# Patient Record
Sex: Male | Born: 1995 | Race: White | Hispanic: No | Marital: Married | Smoking: Former smoker
Health system: Southern US, Community
[De-identification: ages and names within clinical notes are randomized; demographics above are authoritative.]

---
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
## 2020-01-14 DIAGNOSIS — H5213 Myopia, bilateral: Secondary | ICD-10-CM | POA: Diagnosis not present

## 2020-01-14 DIAGNOSIS — H179 Unspecified corneal scar and opacity: Secondary | ICD-10-CM | POA: Diagnosis not present

## 2020-02-20 DIAGNOSIS — Z1152 Encounter for screening for COVID-19: Secondary | ICD-10-CM | POA: Diagnosis not present

## 2020-02-20 DIAGNOSIS — Z20822 Contact with and (suspected) exposure to covid-19: Secondary | ICD-10-CM | POA: Diagnosis not present

## 2021-11-21 ENCOUNTER — Other Ambulatory Visit: Payer: Self-pay

## 2021-11-21 ENCOUNTER — Ambulatory Visit
Admission: RE | Admit: 2021-11-21 | Discharge: 2021-11-21 | Discharge status: Against Medical Advice | Payer: Self-pay | Source: Ambulatory Visit

## 2022-06-01 DIAGNOSIS — M79632 Pain in left forearm: Secondary | ICD-10-CM | POA: Insufficient documentation

## 2022-06-01 DIAGNOSIS — S56912A Strain of unspecified muscles, fascia and tendons at forearm level, left arm, initial encounter: Secondary | ICD-10-CM | POA: Insufficient documentation

## 2022-06-17 ENCOUNTER — Ambulatory Visit
Admission: RE | Admit: 2022-06-17 | Discharge: 2022-06-17 | Disposition: A | Discharge status: Home / Self Care | Payer: Self-pay | Source: Ambulatory Visit | Attending: Internal Medicine | Admitting: Internal Medicine

## 2022-06-17 ENCOUNTER — Ambulatory Visit: Payer: Self-pay

## 2022-06-17 VITALS — BP 118/60 | HR 100 | Temp 97.9°F | Resp 16

## 2022-06-17 DIAGNOSIS — L0501 Pilonidal cyst with abscess: Secondary | ICD-10-CM

## 2022-06-17 MED ORDER — DOXYCYCLINE HYCLATE 100 MG PO CAPS: 100.0000 mg | ORAL_CAPSULE | Freq: Two times a day (BID) | ORAL | 0 refills | Status: AC

## 2022-06-17 NOTE — Discharge Instructions (Signed)
We drained your abscess today in the clinic and left open to continue to drain.  Continue to use warm compresses to the wound to further encourage drainage from the wound.  You may do this in the shower as well with gentle compresses to the area to allow more infected material to drain.   Take antibiotic as prescribed to treat infection to the abscess.   Change your dressings twice daily as the wound heals.  Do not apply any ointments, lotions, or powders to the wound.  You may take Tylenol as needed for pain once the numbing wears off.  If you notice any worsening signs of infection such as redness, swelling, fever, or worsening drainage, please return to urgent care for reevaluation.

## 2022-06-17 NOTE — ED Triage Notes (Signed)
Pt states cyst to right buttocks for the past 4 days.  Area red swollen no drainage noted.

## 2022-06-19 NOTE — ED Provider Notes (Signed)
[facility]    CSN: [id number] Arrival date & time: 06/17/22  1745      History   Chief Complaint Chief Complaint  Patient presents with   Abscess    Possible cyst - Entered by patient    HPI Tyler Jordan is a 27 y.o. male.   Patient presents to urgent care for evaluation of abscess to the superior gluteal cleft that he first noticed 4 days ago. He states the abscess has grown significantly in size and pain over the last 2-3 days and he is experiencing significant pain with remaining in a seated position. He has been performing epsom salt soaks, which has helped the abscess to soften, but states the abscess continues to grow warmer and more tender by the day. No recent antibiotic use. He has not had an abscess in this location in the past. States he has had "sores to the butt cheeks" but never abscess. 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 Denies fever, chills, body aches, recent falls/trauma/injuries to the upper gluteal cleft/low back, dizziness, and drainage from the abscess. Taking tylenol as needed for pain without much relief.    Abscess   History reviewed. No pertinent past medical history.  There are no problems to display for this patient.   History reviewed. No pertinent surgical history.     Home Medications    Prior to Admission medications   Medication Sig Start Date End Date Taking? Authorizing Provider  doxycycline (VIBRAMYCIN) 100 MG capsule Take 1 capsule (100 mg total) by mouth 2 (two) times daily for 7 days. 06/17/22 06/24/22 Yes Carlisle Beers, FNP  dexmethylphenidate (FOCALIN XR) 20 MG 24 hr capsule Take 20 mg by mouth daily.    [provider]    Family History History reviewed. No pertinent family history.  Social History Social History   Tobacco Use   Smoking status: Never  Substance Use Topics   Alcohol use: No   Drug use: No      Allergies   Patient has no known allergies.   Review of Systems Review of Systems Per HPI   Physical Exam Triage Vital Signs ED Triage Vitals  Enc Vitals Group     BP 06/17/22 1818 118/60     Pulse Rate 06/17/22 1818 100     Resp 06/17/22 1818 16     Temp 06/17/22 1818 97.9 F (36.6 C)     Temp Source 06/17/22 1818 Oral     SpO2 06/17/22 1818 97 %     Weight --      Height --      Head Circumference --      Peak Flow --      Pain Score 06/17/22 1817 7     Pain Loc --      Pain Edu? --      Excl. in GC? --    No data found.  Updated Vital Signs BP 118/60 (BP Location: Left Arm)   Pulse 100   Temp 97.9 F (36.6 C) (Oral)   Resp 16   SpO2 97%   Visual Acuity Right Eye Distance:   Left Eye Distance:   Bilateral Distance:    Right Eye Near:   Left Eye Near:    Bilateral Near:     Physical Exam Vitals and nursing note reviewed.  Constitutional:      Appearance: He is not ill-appearing or toxic-appearing.  HENT:     Head: Normocephalic and atraumatic.     Right Ear: Hearing and external ear normal.     Left Ear: Hearing and external ear normal.     Nose: Nose normal.     Mouth/Throat:     Lips: Pink.  Eyes:     General: Lids are normal. Vision grossly intact. Gaze aligned appropriately.     Extraocular Movements: Extraocular movements intact.     Conjunctiva/sclera: Conjunctivae normal.  Pulmonary:     Effort: Pulmonary effort is normal.  Musculoskeletal:     Cervical back: Neck supple.  Skin:    General: Skin is warm and dry.     Capillary Refill: Capillary refill takes less than 2 seconds.     Findings: No rash.          Comments: 2cm by 3cm pilonidal abscess to the right upper gluteal cleft. Central abscess is mildly fluctuant with surrounding area of induration, tenderness, erythema, and warmth.   Neurological:     General: No focal deficit present.     Mental Status: He is alert and oriented to person, place, and time. Mental status is  at baseline.     Cranial Nerves: No dysarthria or facial asymmetry.  Psychiatric:        Mood and Affect: Mood normal.        Speech: Speech normal.        Behavior: Behavior normal.        Thought Content: Thought content normal.        Judgment: Judgment normal.      UC Treatments / Results  Labs (all labs ordered are listed, but only abnormal results are displayed) Labs Reviewed - No data to display  EKG   Radiology No results found.  Procedures Incision and Drainage  Date/Time: 06/17/2022 8:46 PM  Performed by: Carlisle BeersStanhope, Shiva Karis M, FNP Authorized by: Carlisle BeersStanhope, Ernie Sagrero M, FNP   Consent:    Consent obtained:  Verbal   Consent given by:  Patient   Risks, benefits, and alternatives were discussed: yes     Risks discussed:  Bleeding, damage to other organs, infection, incomplete drainage and pain   Alternatives discussed:  No treatment and delayed treatment Universal protocol:    Patient identity confirmed:  Verbally with patient Location:    Type:  Pilonidal cyst   Size:  2cm by 3cm   Location:  Anogenital   Anogenital location:  Pilonidal Pre-procedure details:    Skin preparation:  Antiseptic wash and povidone-iodine Sedation:    Sedation type:  None Anesthesia:    Anesthesia method:  Local infiltration   Local anesthetic:  Lidocaine 1% WITH epi Procedure type:    Complexity:  Simple Procedure details:    Incision types:  Stab incision   Incision depth:  Dermal   Drainage:  Bloody and purulent   Drainage amount:  Scant   Wound treatment:  Wound left open Post-procedure details:    Procedure completion:  Tolerated well, no immediate complications  (including critical care time)  Medications Ordered in UC Medications - No data to display  Initial Impression / Assessment and Plan / UC Course  I have reviewed the triage vital signs and the nursing notes.  Pertinent labs & imaging results that were available during my care of the patient were  reviewed by me and considered in my medical decision making (see chart for details).   1. Pilonidal cyst with abscess See incision and drainage note above for further  detail regarding incision and drainage procedure.  Wound cleansed and dressed in clinic.  Patient instructed to change dressing twice daily with nonstick gauze and tape until the wound heals.  Wound left open to continue to drain.  Patient instructed to gently massage wound in the shower and perform warm compresses to allow wound to drain further and completely.  Tylenol may be used as needed for pain once numbing wears off.  Patient placed on doxycycline antibiotic to be taken as prescribed.  Monitor for worsening signs of infection and encourage patient to return to urgent care for reevaluation if worsening swelling, pain, drainage, or fever/chills.  Patient agreeable with this plan.  Discussed physical exam and available lab work findings in clinic with patient.  Counseled patient regarding appropriate use of medications and potential side effects for all medications recommended or prescribed today. Discussed red flag signs and symptoms of worsening condition,when to call the PCP office, return to urgent care, and when to seek higher level of care in the emergency department. Patient verbalizes understanding and agreement with plan. All questions answered. Patient discharged in stable condition.    Final Clinical Impressions(s) / UC Diagnoses   Final diagnoses:  Pilonidal cyst with abscess     Discharge Instructions      We drained your abscess today in the clinic and left open to continue to drain.  Continue to use warm compresses to the wound to further encourage drainage from the wound.  You may do this in the shower as well with gentle compresses to the area to allow more infected material to drain.   Take antibiotic as prescribed to treat infection to the abscess.   Change your dressings twice daily as the wound heals.   Do not apply any ointments, lotions, or powders to the wound.  You may take Tylenol as needed for pain once the numbing wears off.  If you notice any worsening signs of infection such as redness, swelling, fever, or worsening drainage, please return to urgent care for reevaluation.     ED Prescriptions     Medication Sig Dispense Auth. Provider   doxycycline (VIBRAMYCIN) 100 MG capsule Take 1 capsule (100 mg total) by mouth 2 (two) times daily for 7 days. 14 capsule Carlisle Beers, FNP      PDMP not reviewed this encounter.   Carlisle Beers, [place] 06/20/22 2147

## 2023-02-24 ENCOUNTER — Ambulatory Visit
Admission: EM | Admit: 2023-02-24 | Discharge: 2023-02-24 | Disposition: A | Discharge status: Home / Self Care | Payer: No Typology Code available for payment source

## 2023-02-24 DIAGNOSIS — R1032 Left lower quadrant pain: Secondary | ICD-10-CM | POA: Diagnosis not present

## 2023-02-24 LAB — POCT URINALYSIS DIP (MANUAL ENTRY)
Bilirubin, UA: NEGATIVE
Blood, UA: NEGATIVE
Glucose, UA: NEGATIVE mg/dL
Ketones, POC UA: NEGATIVE mg/dL
Leukocytes, UA: NEGATIVE
Nitrite, UA: NEGATIVE
Protein Ur, POC: NEGATIVE mg/dL
Spec Grav, UA: 1.015 (ref 1.010–1.025)
Urobilinogen, UA: 0.2 U/dL
pH, UA: 6 (ref 5.0–8.0)

## 2023-02-24 MED ORDER — PREDNISONE 20 MG PO TABS: 40.0000 mg | ORAL_TABLET | Freq: Every day | ORAL | 0 refills | Status: AC

## 2023-02-24 MED ORDER — CYCLOBENZAPRINE HCL 10 MG PO TABS: 10.0000 mg | ORAL_TABLET | Freq: Two times a day (BID) | ORAL | 0 refills | Status: AC | PRN

## 2023-02-24 NOTE — ED Triage Notes (Signed)
"  This abd pain started on Sunday or possibly Monday". "It isn't sever enough that I am in tremendous pain but very annoying". No nausea or vomiting. Bowel movements "normal". Voids "normal". Pain seems to be left lower abd area (just above inguinal area). No injury to area or otherwise. 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

## 2023-03-04 ENCOUNTER — Encounter: Payer: Self-pay | Admitting: Physician Assistant

## 2023-03-04 NOTE — ED Provider Notes (Signed)
[facility]    CSN: [id number] Arrival date & time: 02/24/23  1007      History   Chief Complaint Chief Complaint  Patient presents with   Abdominal Pain    HPI Tyler Jordan is a 27 y.o. male.   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  He denies any other injury.  He denies any vomiting or diarrhea.  He has not any nausea.  He denies any blood in his stool or dark tarry stools.  He notes movement seems to worsen symptoms.  The history is provided by the patient.  Abdominal Pain Associated symptoms: no chills, no constipation, no diarrhea, no dysuria, no fever, no hematuria, no nausea, no shortness of breath and no vomiting     History reviewed. No pertinent past medical history.  Patient Active Problem List   Diagnosis Date Noted   Pain in both forearms 06/01/2022   Forearm strain, left, initial encounter 06/01/2022    History reviewed. No pertinent surgical history.     Home Medications    Prior to Admission medications   Medication Sig Start Date End Date Taking? Authorizing Provider  cyclobenzaprine (FLEXERIL) 10 MG tablet Take 1 tablet (10 mg total) by mouth 2 (two) times daily as needed for muscle spasms. 02/24/23  Yes Tomi Bamberger, PA-C  meloxicam (MOBIC) 15 MG tablet Take 15 mg by mouth daily. 02/08/22  Yes [provider]  dexmethylphenidate (FOCALIN XR) 20 MG 24 hr capsule Take 20 mg by mouth daily.    [provider]    Family History History reviewed. No pertinent family history.  Social History Social History   Tobacco Use   Smoking status: Former    Types: Cigarettes    Passive exposure: Never   Smokeless tobacco: Never  Vaping Use   Vaping status: Former   Substances: Nicotine, Flavoring  Substance Use Topics   Alcohol use: Yes    Comment: Socially.   Drug use: Never     Allergies   Patient has no known  allergies.   Review of Systems Review of Systems  Constitutional:  Negative for chills and fever.  Eyes:  Negative for discharge and redness.  Respiratory:  Negative for shortness of breath.   Gastrointestinal:  Positive for abdominal pain. Negative for blood in stool, constipation, diarrhea, nausea and vomiting.  Genitourinary:  Negative for dysuria and hematuria.  Neurological:  Negative for numbness.     Physical Exam Triage Vital Signs ED Triage Vitals  Encounter Vitals Group     BP 02/24/23 1047 114/73     Systolic BP Percentile --      Diastolic BP Percentile --      Pulse Rate 02/24/23 1047 90     Resp 02/24/23 1047 18     Temp 02/24/23 1047 98.1 F (36.7 C)     Temp Source 02/24/23 1047 Oral     SpO2 02/24/23 1047 99 %     Weight 02/24/23 1045 185 lb (83.9 kg)     Height 02/24/23 1045 5\' 11"  (1.803 m)     Head Circumference --      Peak Flow --      Pain Score 02/24/23 1041 4     Pain Loc --      Pain Education --      Exclude from Growth Chart --    No data found.  Updated Vital Signs BP 114/73 (BP Location: Left Arm)   Pulse 90   Temp 98.1 F (36.7 C) (Oral)   Resp 18   Ht 5\' 11"  (1.803 m)   Wt 185 lb (83.9 kg)   SpO2 99%   BMI 25.80 kg/m   Visual Acuity Right Eye Distance:   Left Eye Distance:   Bilateral Distance:    Right Eye Near:   Left Eye Near:    Bilateral Near:     Physical Exam Vitals and nursing note reviewed.  Constitutional:      General: He is not in acute distress.    Appearance: Normal appearance. He is not ill-appearing.  HENT:     Head: Normocephalic and atraumatic.  Eyes:     Conjunctiva/sclera: Conjunctivae normal.  Cardiovascular:     Rate and Rhythm: Normal rate and regular rhythm.  Pulmonary:     Effort: Pulmonary effort is normal. No respiratory distress.     Breath sounds: Normal breath sounds. No wheezing, rhonchi or rales.  Abdominal:     General: Abdomen is flat. Bowel sounds are normal. There is no  distension.     Palpations: Abdomen is soft.     Tenderness: There is no abdominal tenderness. There is no guarding or rebound.  Neurological:     Mental Status: He is alert.  Psychiatric:        Mood and Affect: Mood normal.        Behavior: Behavior normal.        Thought Content: Thought content normal.      UC Treatments / Results  Labs (all labs ordered are listed, but only abnormal results are displayed) Labs Reviewed  POCT URINALYSIS DIP (MANUAL ENTRY) - Abnormal; Notable for the following components:      Result Value   Clarity, UA cloudy (*)    All other components within normal limits    EKG   Radiology No results found.  Procedures Procedures (including critical care time)  Medications Ordered in UC Medications - No data to display  Initial Impression / Assessment and Plan / UC Course  I have reviewed the triage vital signs and the nursing notes.  Pertinent labs & imaging results that were available during my care of the patient were reviewed by me and considered in my medical decision making (see chart for details).    Suspect symptoms most likely due to muscle strain and Eddison treat with steroid burst and muscle relaxer.  Advised muscle relaxer may cause drowsiness and further evaluation in the emergency room with any worsening.  Patient expressed understanding.  Final Clinical Impressions(s) / UC Diagnoses   Final diagnoses:  Abdominal pain, left lower quadrant   Discharge Instructions   None    ED Prescriptions     Medication Sig Dispense Auth. Provider   predniSONE (DELTASONE) 20 MG tablet Take 2 tablets (40 mg total) by mouth daily with breakfast for 5 days. 10 tablet Erma Pinto F, PA-C   cyclobenzaprine (FLEXERIL) 10 MG tablet Take 1 tablet (10 mg total) by mouth 2 (two) times daily as needed for muscle spasms. 20 tablet Tomi Bamberger, PA-C      PDMP not reviewed this encounter.   Tomi Bamberger, PA-C 03/04/23 1432

## 2023-08-24 ENCOUNTER — Other Ambulatory Visit: Payer: Self-pay | Admitting: Nurse Practitioner

## 2023-08-24 ENCOUNTER — Ambulatory Visit
Admission: RE | Admit: 2023-08-24 | Discharge: 2023-08-24 | Disposition: A | Discharge status: Home / Self Care | Source: Ambulatory Visit | Attending: Nurse Practitioner | Admitting: Nurse Practitioner

## 2023-08-24 DIAGNOSIS — Z021 Encounter for pre-employment examination: Secondary | ICD-10-CM
